# Patient Record
Sex: Female | Born: 1935 | Race: White | Hispanic: No
Health system: Southern US, Community
[De-identification: ages and names within clinical notes are randomized; demographics above are authoritative.]

---

## 2000-04-21 ENCOUNTER — Ambulatory Visit (HOSPITAL_COMMUNITY): Admission: RE | Admit: 2000-04-21 | Discharge: 2000-04-21 | Payer: Self-pay | Admitting: Internal Medicine

## 2000-11-22 ENCOUNTER — Ambulatory Visit (HOSPITAL_COMMUNITY): Admission: RE | Admit: 2000-11-22 | Discharge: 2000-11-22 | Payer: Self-pay | Admitting: Neurosurgery

## 2000-11-22 ENCOUNTER — Encounter: Payer: Self-pay | Admitting: Neurosurgery

## 2002-02-06 ENCOUNTER — Encounter: Payer: Self-pay | Admitting: Internal Medicine

## 2002-02-06 ENCOUNTER — Ambulatory Visit (HOSPITAL_COMMUNITY): Admission: RE | Admit: 2002-02-06 | Discharge: 2002-02-06 | Payer: Self-pay | Admitting: Internal Medicine

## 2002-03-06 ENCOUNTER — Ambulatory Visit (HOSPITAL_COMMUNITY): Admission: RE | Admit: 2002-03-06 | Discharge: 2002-03-06 | Payer: Self-pay | Admitting: Internal Medicine

## 2002-06-23 ENCOUNTER — Encounter (INDEPENDENT_AMBULATORY_CARE_PROVIDER_SITE_OTHER): Payer: Self-pay | Admitting: Internal Medicine

## 2002-06-23 ENCOUNTER — Ambulatory Visit (HOSPITAL_COMMUNITY): Admission: RE | Admit: 2002-06-23 | Discharge: 2002-06-23 | Payer: Self-pay | Admitting: Internal Medicine

## 2002-08-21 ENCOUNTER — Ambulatory Visit (HOSPITAL_COMMUNITY): Admission: RE | Admit: 2002-08-21 | Discharge: 2002-08-22 | Payer: Self-pay | Admitting: Ophthalmology

## 2002-08-21 ENCOUNTER — Encounter (INDEPENDENT_AMBULATORY_CARE_PROVIDER_SITE_OTHER): Payer: Self-pay | Admitting: *Deleted

## 2003-01-09 ENCOUNTER — Ambulatory Visit (HOSPITAL_COMMUNITY): Admission: RE | Admit: 2003-01-09 | Discharge: 2003-01-09 | Payer: Self-pay | Admitting: Neurology

## 2003-01-16 ENCOUNTER — Ambulatory Visit (HOSPITAL_COMMUNITY): Admission: RE | Admit: 2003-01-16 | Discharge: 2003-01-16 | Payer: Self-pay | Admitting: Neurology

## 2003-01-29 ENCOUNTER — Ambulatory Visit (HOSPITAL_BASED_OUTPATIENT_CLINIC_OR_DEPARTMENT_OTHER): Admission: RE | Admit: 2003-01-29 | Discharge: 2003-01-29 | Payer: Self-pay | Admitting: Neurology

## 2003-01-31 ENCOUNTER — Ambulatory Visit (HOSPITAL_COMMUNITY): Admission: RE | Admit: 2003-01-31 | Discharge: 2003-01-31 | Payer: Self-pay | Admitting: Neurology

## 2003-01-31 ENCOUNTER — Encounter (INDEPENDENT_AMBULATORY_CARE_PROVIDER_SITE_OTHER): Payer: Self-pay | Admitting: *Deleted

## 2003-02-09 ENCOUNTER — Ambulatory Visit: Admission: RE | Admit: 2003-02-09 | Discharge: 2003-02-09 | Payer: Self-pay | Admitting: Neurology

## 2003-03-01 ENCOUNTER — Ambulatory Visit (HOSPITAL_COMMUNITY): Admission: RE | Admit: 2003-03-01 | Discharge: 2003-03-01 | Payer: Self-pay | Admitting: Orthopedic Surgery

## 2003-03-01 ENCOUNTER — Ambulatory Visit (HOSPITAL_BASED_OUTPATIENT_CLINIC_OR_DEPARTMENT_OTHER): Admission: RE | Admit: 2003-03-01 | Discharge: 2003-03-01 | Payer: Self-pay | Admitting: Orthopedic Surgery

## 2003-03-01 ENCOUNTER — Encounter (INDEPENDENT_AMBULATORY_CARE_PROVIDER_SITE_OTHER): Payer: Self-pay | Admitting: Specialist

## 2003-03-01 ENCOUNTER — Encounter: Admission: RE | Admit: 2003-03-01 | Discharge: 2003-03-01 | Payer: Self-pay | Admitting: Orthopedic Surgery

## 2004-01-25 ENCOUNTER — Emergency Department (HOSPITAL_COMMUNITY): Admission: EM | Admit: 2004-01-25 | Discharge: 2004-01-25 | Payer: Self-pay | Admitting: Emergency Medicine

## 2004-01-30 ENCOUNTER — Observation Stay (HOSPITAL_COMMUNITY): Admission: RE | Admit: 2004-01-30 | Discharge: 2004-01-31 | Payer: Self-pay | Admitting: General Surgery

## 2004-01-30 ENCOUNTER — Ambulatory Visit: Payer: Self-pay | Admitting: *Deleted

## 2004-02-14 ENCOUNTER — Ambulatory Visit: Payer: Self-pay | Admitting: *Deleted

## 2004-09-22 ENCOUNTER — Ambulatory Visit (HOSPITAL_COMMUNITY): Admission: RE | Admit: 2004-09-22 | Discharge: 2004-09-22 | Payer: Self-pay | Admitting: General Surgery

## 2005-03-18 ENCOUNTER — Ambulatory Visit (HOSPITAL_COMMUNITY): Admission: RE | Admit: 2005-03-18 | Discharge: 2005-03-18 | Payer: Self-pay | Admitting: Internal Medicine

## 2005-03-18 ENCOUNTER — Ambulatory Visit: Payer: Self-pay | Admitting: Internal Medicine

## 2005-07-17 ENCOUNTER — Ambulatory Visit: Payer: Self-pay | Admitting: *Deleted

## 2005-07-20 ENCOUNTER — Ambulatory Visit (HOSPITAL_COMMUNITY): Admission: RE | Admit: 2005-07-20 | Discharge: 2005-07-20 | Payer: Self-pay | Admitting: *Deleted

## 2005-07-20 ENCOUNTER — Ambulatory Visit: Payer: Self-pay | Admitting: *Deleted

## 2005-07-21 ENCOUNTER — Ambulatory Visit (HOSPITAL_COMMUNITY): Admission: RE | Admit: 2005-07-21 | Discharge: 2005-07-21 | Payer: Self-pay | Admitting: *Deleted

## 2005-07-21 ENCOUNTER — Ambulatory Visit: Payer: Self-pay | Admitting: Cardiology

## 2005-07-28 ENCOUNTER — Ambulatory Visit: Payer: Self-pay | Admitting: *Deleted

## 2005-12-24 ENCOUNTER — Ambulatory Visit: Payer: Self-pay | Admitting: Cardiology

## 2006-01-27 ENCOUNTER — Ambulatory Visit: Payer: Self-pay | Admitting: Cardiology

## 2006-12-31 ENCOUNTER — Ambulatory Visit: Payer: Self-pay | Admitting: Internal Medicine

## 2007-01-31 DIAGNOSIS — G4730 Sleep apnea, unspecified: Secondary | ICD-10-CM | POA: Insufficient documentation

## 2007-01-31 DIAGNOSIS — I10 Essential (primary) hypertension: Secondary | ICD-10-CM | POA: Insufficient documentation

## 2007-02-01 ENCOUNTER — Ambulatory Visit: Payer: Self-pay | Admitting: Internal Medicine

## 2007-02-09 ENCOUNTER — Ambulatory Visit: Payer: Self-pay | Admitting: Pulmonary Disease

## 2007-02-09 DIAGNOSIS — R05 Cough: Secondary | ICD-10-CM

## 2007-02-09 DIAGNOSIS — R0602 Shortness of breath: Secondary | ICD-10-CM

## 2007-02-09 DIAGNOSIS — N19 Unspecified kidney failure: Secondary | ICD-10-CM | POA: Insufficient documentation

## 2007-02-15 ENCOUNTER — Encounter: Payer: Self-pay | Admitting: Internal Medicine

## 2007-03-18 ENCOUNTER — Ambulatory Visit: Payer: Self-pay | Admitting: Internal Medicine

## 2007-03-18 DIAGNOSIS — J9610 Chronic respiratory failure, unspecified whether with hypoxia or hypercapnia: Secondary | ICD-10-CM | POA: Insufficient documentation

## 2007-12-28 ENCOUNTER — Ambulatory Visit: Payer: Self-pay | Admitting: Cardiology

## 2008-01-23 ENCOUNTER — Telehealth (INDEPENDENT_AMBULATORY_CARE_PROVIDER_SITE_OTHER): Payer: Self-pay | Admitting: *Deleted

## 2008-02-15 ENCOUNTER — Ambulatory Visit: Payer: Self-pay | Admitting: Internal Medicine

## 2008-02-15 DIAGNOSIS — J449 Chronic obstructive pulmonary disease, unspecified: Secondary | ICD-10-CM | POA: Insufficient documentation

## 2008-02-15 DIAGNOSIS — J4489 Other specified chronic obstructive pulmonary disease: Secondary | ICD-10-CM | POA: Insufficient documentation

## 2008-03-28 ENCOUNTER — Ambulatory Visit: Payer: Self-pay | Admitting: Internal Medicine

## 2008-03-28 DIAGNOSIS — J82 Pulmonary eosinophilia, not elsewhere classified: Secondary | ICD-10-CM

## 2008-04-10 ENCOUNTER — Encounter: Payer: Self-pay | Admitting: Internal Medicine

## 2008-04-11 ENCOUNTER — Encounter: Payer: Self-pay | Admitting: Internal Medicine

## 2008-04-16 ENCOUNTER — Telehealth (INDEPENDENT_AMBULATORY_CARE_PROVIDER_SITE_OTHER): Payer: Self-pay | Admitting: *Deleted

## 2008-04-24 ENCOUNTER — Encounter: Payer: Self-pay | Admitting: Internal Medicine

## 2008-07-03 ENCOUNTER — Ambulatory Visit: Payer: Self-pay | Admitting: Internal Medicine

## 2008-10-11 ENCOUNTER — Ambulatory Visit: Payer: Self-pay | Admitting: Internal Medicine

## 2008-12-13 ENCOUNTER — Ambulatory Visit: Payer: Self-pay | Admitting: Internal Medicine

## 2008-12-14 ENCOUNTER — Telehealth (INDEPENDENT_AMBULATORY_CARE_PROVIDER_SITE_OTHER): Payer: Self-pay | Admitting: *Deleted

## 2008-12-14 LAB — CONVERTED CEMR LAB
BUN: 10 mg/dL (ref 6–23)
Basophils Absolute: 0 10*3/uL (ref 0.0–0.1)
Basophils Relative: 0.8 % (ref 0.0–3.0)
CO2: 32 meq/L (ref 19–32)
Calcium: 9.3 mg/dL (ref 8.4–10.5)
Chloride: 104 meq/L (ref 96–112)
Creatinine, Ser: 1.2 mg/dL (ref 0.4–1.2)
Eosinophils Absolute: 0.2 10*3/uL (ref 0.0–0.7)
Eosinophils Relative: 3.9 % (ref 0.0–5.0)
GFR calc non Af Amer: 45.13 mL/min (ref >60)
Glucose, Bld: 99 mg/dL (ref 70–99)
HCT: 37.2 % (ref 36.0–46.0)
Hemoglobin: 12.7 g/dL (ref 12.0–15.0)
Lymphocytes Relative: 34.8 % (ref 12.0–46.0)
Lymphs Abs: 1.6 10*3/uL (ref 0.7–4.0)
MCHC: 34.1 g/dL (ref 30.0–36.0)
MCV: 93.4 fL (ref 78.0–100.0)
Monocytes Absolute: 0.3 10*3/uL (ref 0.1–1.0)
Monocytes Relative: 6.7 % (ref 3.0–12.0)
Neutro Abs: 2.5 10*3/uL (ref 1.4–7.7)
Neutrophils Relative %: 53.8 % (ref 43.0–77.0)
Platelets: 166 10*3/uL (ref 150.0–400.0)
Potassium: 4.6 meq/L (ref 3.5–5.1)
Pro B Natriuretic peptide (BNP): 119 pg/mL — ABNORMAL HIGH (ref 0.0–100.0)
RBC: 3.98 M/uL (ref 3.87–5.11)
RDW: 12.2 % (ref 11.5–14.6)
Sed Rate: 27 mm/hr — ABNORMAL HIGH (ref 0–22)
Sodium: 144 meq/L (ref 135–145)
TSH: 2.48 microintl units/mL (ref 0.35–5.50)
WBC: 4.6 10*3/uL (ref 4.5–10.5)

## 2008-12-18 ENCOUNTER — Telehealth (INDEPENDENT_AMBULATORY_CARE_PROVIDER_SITE_OTHER): Payer: Self-pay | Admitting: *Deleted

## 2008-12-19 ENCOUNTER — Encounter: Payer: Self-pay | Admitting: Internal Medicine

## 2008-12-21 ENCOUNTER — Telehealth (INDEPENDENT_AMBULATORY_CARE_PROVIDER_SITE_OTHER): Payer: Self-pay | Admitting: *Deleted

## 2008-12-24 ENCOUNTER — Telehealth: Payer: Self-pay | Admitting: Internal Medicine

## 2009-01-15 ENCOUNTER — Encounter: Payer: Self-pay | Admitting: Internal Medicine

## 2010-02-25 NOTE — Letter (Signed)
Summary: CMN- Supplies / [facility]  CMN- Supplies / [facility]   Imported By: Lennie Odor 01/28/2009 15:31:31  _____________________________________________________________________  External Attachment:    Type:   Image     Comment:   External Document

## 2010-02-27 ENCOUNTER — Telehealth (INDEPENDENT_AMBULATORY_CARE_PROVIDER_SITE_OTHER): Payer: Self-pay | Admitting: *Deleted

## 2010-03-03 ENCOUNTER — Ambulatory Visit (INDEPENDENT_AMBULATORY_CARE_PROVIDER_SITE_OTHER): Payer: Medicare Other | Admitting: Adult Health

## 2010-03-03 ENCOUNTER — Encounter: Payer: Self-pay | Admitting: Adult Health

## 2010-03-03 DIAGNOSIS — J45909 Unspecified asthma, uncomplicated: Secondary | ICD-10-CM

## 2010-03-03 DIAGNOSIS — J9610 Chronic respiratory failure, unspecified whether with hypoxia or hypercapnia: Secondary | ICD-10-CM

## 2010-03-05 DIAGNOSIS — J45909 Unspecified asthma, uncomplicated: Secondary | ICD-10-CM | POA: Insufficient documentation

## 2010-03-05 NOTE — Progress Notes (Signed)
Summary: needs nebulizer meds<<<OV w/ TP  Phone Note Call from Patient   Caller: Daughter Melrose Nakayama Call For: wert Summary of Call: caller says [facility] is waiting on order for meds (for nebulizor). daughter nancy # [phone] Initial call taken by: Tivis Ringer, CNA,  February 27, 2010 3:01 PM  Follow-up for Phone Call        Pt needs OV for refills. She was last seen by [facility]  12/13/2008. Daughter aware but says pt only has enough medication for a few days.  Pt will see TP on Mon., 03/03/2010 to get refills then will need OV w/ MW. Follow-up by: Michel Bickers CMA,  February 27, 2010 4:56 PM

## 2010-03-07 ENCOUNTER — Encounter: Payer: Self-pay | Admitting: Internal Medicine

## 2010-03-12 ENCOUNTER — Encounter: Payer: Self-pay | Admitting: Adult Health

## 2010-03-13 NOTE — Assessment & Plan Note (Signed)
Summary: Acute NP office visit - COPD   Primary Provider/Referring Provider:  Sherril Croon   CC:  refill neb meds   Brovana and Pulmicort; gets from [facility] pt of Dr Sherene Sires.  History of Present Illness: 16  yowf never smoker with indolent onset chronic dry cough x 2007  better off bisphosponates and using brovana and pulmicort.  February 15, 2008 co doe x housework ok to mailbox but not up the street,  maybe would go shopping more if had better wind, using xopenex regularly, not as needed, care taker wants to know what to use next exac (happens twice a year on or off maint rx).  No sign cough. rec stop xopenex and consider wean down  Brovana and Budesonide, the equivalent of Symbicort, per neb. Not clear she did either.  March 28, 2008 ov no cough but still sob taking a shower but not sleeping on 02 .  Not clear how much she uses her nebs, caretaker confused re previous written instructions. no noct problems, overnight pulse ox ok despite desaturation with exertion so encouraged to use 02 as needed doe.  July 03, 2008 ov with increasing fatigue and doe x car > house. not following contingency instructions. no increase cough.  rec  resume twice daily nebulizer treatments with brovana with budesonide and supplement with xopenex up to every 4 hours if needed. if limited by short of breath with activity, wear 02 as needed  October 11, 2008 ov not limited by breathing able to do dollar store without 02. rec walk on 02 if need to walk > 50 ft.  December 13, 2008 cc gradually worse x last sev week sob and cough with cxr at [facility] "ok" no persistently purulent sputum, some better with nebs, not using 02 with activity as rec.   March 05, 2010--Returns for follow up and med refills. Seen >1 year ago has been doing okays since last vist. Needs refill on neb meds. She is tolerating her meds well. She is with her family member today. No flare of symptoms lately. no steroids use since last ov. Wears o2 as needed  with walking. Denies chest pain, dyspnea, orthopnea, hemoptysis, fever, n/v/d, edema, headache.     Medications Prior to Update: 1)  Pulmicort 0.5 Mg/71ml  Susp (Budesonide (Inhalation)) .... One  Twice Daily 2)  Brovana 15 Mcg/61ml  Nebu (Arformoterol Tartrate) .... One Twice Daily 3)  Restasis 0.05 %  Emul (Cyclosporine) .Marland Kitchen.. 1 Drop Each Eye Two Times A Day 4)  Lumigan 0.03 %  Soln (Bimatoprost) .Marland Kitchen.. 1 Drop Each Eye At Bedtime 5)  Namenda 5 Mg  Tabs (Memantine Hcl) .... Take 1 Tablet By Mouth Two Times A Day 6)  Paroxetine Hcl 20 Mg  Tabs (Paroxetine Hcl) .... Take 1 Tablet By Mouth Once A Day 7)  Alprazolam 0.5 Mg  Tabs (Alprazolam) .... Take 1 Tab By Mouth At Bedtime 8)  Furosemide 20 Mg  Tabs (Furosemide) .... Take 1 Tablet By Mouth Once A Day 9)  Namenda 10 Mg Tabs (Memantine Hcl) .Marland Kitchen.. 1 Two Times A Day 10)  Calcium 500/d 500-125 Mg-Unit  Tabs (Calcium Carbonate-Vitamin D) .... Take 1 Tablet By Mouth Two Times A Day 11)  Multivitamins   Tabs (Multiple Vitamin) .... Take 1 Tablet By Mouth Once A Day 12)  Metoprolol Succinate 50 Mg  Tb24 (Metoprolol Succinate) .... Take 1 Tablet By Mouth Once A Day 13)  Prilosec Otc 20 Mg  Tbec (Omeprazole Magnesium) .... Take One  30-60 Min Before First and Last Meals of The Day 14)  Ocuvite Preservision   Tabs (Multiple Vitamins-Minerals) .Marland Kitchen.. 1 Two Times A Day 15)  Tylenol Extra Strength 500 Mg  Tabs (Acetaminophen) .... 2 Every 4 Hours As Needed 16)  Delsym 30 Mg/56ml  Lqcr (Dextromethorphan Polistirex) .... 2 Tsp Every 12 Hours As Needed 17)  Xopenex 0.63 Mg/103ml Nebu (Levalbuterol Hcl) .... Neb Every 4 Hours Only If Needed 18)  Seroquel 25 Mg Tabs (Quetiapine Fumarate) .Marland Kitchen.. 1 Once Daily 19)  Aspirin Adult Low Strength 81 Mg Tbec (Aspirin) .Marland Kitchen.. 1 Once Daily 20)  Pepcid Ac Maximum Strength 20 Mg Tabs (Famotidine) .... One At Bedtime  Current Medications (verified): 1)  Pulmicort 0.25 Mg/70ml Susp (Budesonide) .... One Two Times A Day 2)  Brovana 15  Mcg/70ml  Nebu (Arformoterol Tartrate) .... One Twice Daily 3)  Restasis 0.05 %  Emul (Cyclosporine) .Marland Kitchen.. 1 Drop Each Eye Two Times A Day 4)  Lumigan 0.03 %  Soln (Bimatoprost) .Marland Kitchen.. 1 Drop Each Eye At Bedtime 5)  Namenda 5 Mg  Tabs (Memantine Hcl) .... Take 1 Tablet By Mouth Two Times A Day 6)  Paroxetine Hcl 20 Mg  Tabs (Paroxetine Hcl) .... Take 1 Tablet By Mouth Once A Day 7)  Alprazolam 0.5 Mg  Tabs (Alprazolam) .... Take 1 Tab By Mouth At Bedtime 8)  Furosemide 20 Mg  Tabs (Furosemide) .... Take 1 Tablet By Mouth Once A Day 9)  Namenda 10 Mg Tabs (Memantine Hcl) .Marland Kitchen.. 1 Two Times A Day 10)  Calcium 500/d 500-125 Mg-Unit  Tabs (Calcium Carbonate-Vitamin D) .... Take 1 Tablet By Mouth Two Times A Day 11)  Multivitamins   Tabs (Multiple Vitamin) .... Take 1 Tablet By Mouth Once A Day 12)  Metoprolol Succinate 50 Mg  Tb24 (Metoprolol Succinate) .... Take 1 Tablet By Mouth Once A Day 13)  Prilosec Otc 20 Mg  Tbec (Omeprazole Magnesium) .... Take One 30-60 Min Before First and Last Meals of The Day 14)  Ocuvite Preservision   Tabs (Multiple Vitamins-Minerals) .Marland Kitchen.. 1 Two Times A Day 15)  Tylenol Extra Strength 500 Mg  Tabs (Acetaminophen) .... 2 Every 4 Hours As Needed 16)  Delsym 30 Mg/20ml  Lqcr (Dextromethorphan Polistirex) .... 2 Tsp Every 12 Hours As Needed 17)  Xopenex 0.63 Mg/94ml Nebu (Levalbuterol Hcl) .... Neb Every 4 Hours Only If Needed 18)  Seroquel 25 Mg Tabs (Quetiapine Fumarate) .Marland Kitchen.. 1 Once Daily 19)  Aspirin Adult Low Strength 81 Mg Tbec (Aspirin) .Marland Kitchen.. 1 Once Daily 20)  Pepcid Ac Maximum Strength 20 Mg Tabs (Famotidine) .... One At Bedtime 21)  Azopt 1 % Susp (Brinzolamide) .Marland Kitchen.. 1 Drop Each Eye Two Times A Day  Allergies (verified): No Known Drug Allergies  Past History:  Past Medical History: Last updated: 12/13/2008 RENAL FAILURE (ICD-586) DYSPNEA (ICD-786.05)      - 02 dep with activity established  October 11, 2008  COUGH, CHRONIC (ICD-786.2) SLEEP APNEA  (ICD-780.57) HYPERTENSION (ICD-401.9)  Family History: Last updated: 03/18/2007 negative for respiratory  disease or allergies  Social History: Last updated: 03/18/2007 she has never smoked - dependent on her care takers for meds  Risk Factors: Smoking Status: never (02/09/2007)  Review of Systems      See HPI  Vital Signs:  Patient profile:   77 year old female Height:      66 inches Weight:      142 pounds BMI:     23.00 O2 Sat:  92 % on Room air Temp:     97.9 degrees F oral Pulse rate:   59 / minute BP sitting:   140 / 80  (left arm) Cuff size:   regular  Vitals Entered By: Kandice Hams CMA (March 03, 2010 2:29 PM)  O2 Flow:  Room air CC: refill neb meds   Brovana and Pulmicort; gets from [facility]. pt of Dr Sherene Sires Is Patient Diabetic? No Comments pharmacy [facility]; [facility]  Ambulatory Pulse Oximetry  Resting; HR__62___    02 Sat___94ra__  Lap1 (185 feet)   HR__75___   02 Sat__84ra___.  pt completed 3/4 lap.  returned pt to exam room, immediately placed on 2L o2.  recovery sat=94%. Lap2 (185 feet)   HR_____   02 Sat_____    Lap3 (185 feet)   HR_____   02 Sat_____  ___Test Completed without Difficulty _X__Test Stopped due to: decreased 02 sats.  Boone Master CNA/MA  March 03, 2010 3:58 PM    Physical Exam  Additional Exam:  in general very frail amb wf walking with rolling walker nad  wt  148 February 15, 2008 > 147 July 03, 2008  > 148 October 11, 2008 > 144 December 13, 2008 >>142 03/03/10 HEENT mild turbinate edema.  Oropharynx no thrush or excess pnd or cobblestoning.  No JVD or cervical adenopathy. Mild accessory muscle hypertrophy. Trachea midline, nl thryroid. Chest was hyperinflated by percussion with diminished breath sounds and moderate increased exp time without wheeze.  Regular rate and rhythm without murmur gallop or rub or increase P2.  no edema Abd: no hsm, nl excursion. Ext warm without cyanosis or  clubbing    Impression & Recommendations:  Problem # 1:  REACTIVE AIRWAY DISEASE (ICD-493.90) Compensated on brovana and budesonide neb  cont on current regimen  Medications Added to Medication List This Visit: 1)  Pulmicort 0.25 Mg/74ml Susp (Budesonide) .... One two times a day 2)  Azopt 1 % Susp (Brinzolamide) .Marland Kitchen.. 1 drop each eye two times a day  Other Orders: Est. Patient Level III ([address])  Patient Instructions: 1)  Wear Oxygen 2 l/m with actiivity  2)  Continue on same meds  3)  follow up Dr. Sherene Sires in 4months  4)  Please contact office for sooner follow up if symptoms do not improve or worsen  Prescriptions: PULMICORT 0.25 MG/2ML SUSP (BUDESONIDE) one two times a day  #60 x 11   Entered by:   Boone Master CNA/MA   Authorized by:   Rubye Oaks NP   Signed by:   Boone Master CNA/MA on 03/03/2010   Method used:   Printed then faxed to ...       [facility]* (retail)       [address]       [place], [place]  [address]       Ph: [phone]       Fax: [phone]   RxID:   [id number] BROVANA 15 MCG/2ML  NEBU (ARFORMOTEROL TARTRATE) one twice daily  #60 x 11   Entered and Authorized by:   Rubye Oaks NP   Signed by:   Tammy Parrett NP on 03/03/2010   Method used:   Print then Give to Patient   RxID:   [id number] PULMICORT 0.5 MG/2ML  SUSP (BUDESONIDE (INHALATION)) one  twice daily  #60 x 11   Entered and Authorized by:   Rubye Oaks NP  Signed by:   Tammy Parrett NP on 03/03/2010   Method used:   Print then Give to Patient   RxID:   [id number]

## 2010-03-19 NOTE — Letter (Signed)
Summary: CMN for Pulmicort & Brovana/[facility]  CMN for Pulmicort & Brovana/[facility]   Imported By: Sherian Rein 03/14/2010 15:13:32  _____________________________________________________________________  External Attachment:    Type:   Image     Comment:   External Document

## 2010-03-25 NOTE — Letter (Signed)
Summary: CMN for Nebulizer & Meds/[facility]  CMN for Nebulizer & Meds/[facility]   Imported By: Sherian Rein 03/17/2010 12:27:52  _____________________________________________________________________  External Attachment:    Type:   Image     Comment:   External Document

## 2010-06-10 NOTE — Assessment & Plan Note (Signed)
[facility]                             PULMONARY OFFICE NOTE   NAME:Lotter, LORIE CLECKLEY                    MRN:          [id number]  DATE:12/31/2006                            DOB:          1919-03-30    CHIEF COMPLAINT:  Cough and dyspnea.   HISTORY:  This is an 77 year old, white female, with chronic cough and  dyspnea dating back several years associated with swallowing difficulty.  Interestingly, she was felt to have possible aspiration a year ago and  underwent a barium swallow that was normal.  However, she tells me that  she just recently underwent another bedside evaluation during her  hospitalization two weeks ago and was told that she was indeed  aspirating by a speech therapist.  (This record is not available.)   The cough she has is more a daytime than nighttime and is actually  minimally productive.  She also has dyspnea with minimal activity to the  point where she cannot walk room to room without giving out.  She  denies any pleuritic pain ongoing, present difficulty swallowing,  fevers, chills, sweats, orthopnea, PND, or leg swelling but is feeling  some better after a recent round of prednisone.   PAST MEDICAL HISTORY:  1. Hypertension.  2. Kidney failure.  3. Sleep apnea.  4. Hysterectomy.  5. Appendectomy.  6. Remote sinus surgery.  7. Back surgery.   ALLERGIES:  None known.   MEDICATIONS:  1. Omeprazole dosed with meals.  2. Metoprolol at 50 mg per day.  3. Albuterol per nebulizer four times a day, but she does not think it      really helps.  For a full inventory with dosing, please see medication face sheet  accommodated December 31, 2006.   SOCIAL HISTORY:  She has never smoked.  She is retired from [employer]  where she was exposed to chemicals in the [employer] but had no  respiratory complaints then.   FAMILY HISTORY:  Negative for respiratory diseases or atypia.   REVIEW OF SYSTEMS:  Taken in detail on  the worksheet and negative except  as outlined above.   PHYSICAL EXAMINATION:  GENERAL:  This is a frail, elderly, white female,  who lets her daughter do all of her talking for her.  VITAL SIGNS:  She is afebrile with stable vital signs.  HEENT:  Unremarkable.  NECK:  Supple without cervical adenopathy or tenderness, trachea is  midline, no thyromegaly.  CHEST:  The lung fields were completely clear bilaterally to  auscultation and percussion.  HEART:  There is a regular rhythm without murmur, gallop, or rub.  ABDOMEN:  Soft, benign.  EXTREMITIES:  Without any calf tenderness, cyanosis, clubbing, or edema.   CT scan of the sinuses was performed on September 01, 2005, and reveals  changes consistent with chronic sinusitis.  CT scan of the chest was  performed on December 06, 2006, and shows scarring in the lung bases.   IMPRESSION:  This patient has a classic history that would suggest  intermittent aspiration and goes along with her general geriatric  failure to thrive.  We may not be able to do much more to help her, but  I thought it might be worth treating the airway component of the problem  more aggressively if she tolerates as follows:   Stop Ipratropium now because she has never smoked and, in general,  Atrovent is a drug for chronic obstructive pulmonary disease, which does  not apply here.  Try a Brovana and Budesonide in combination every 12  hours instead and continue to take Omeprazole, but dose it 36 minutes  before her first and last meal on an empiric basis for four weeks before  returning here.   In addition, because she may be getting spillover beta effect from  Metoprolol, I have recommended that she change the dose to one-half  b.i.d. to avoid any peaks that occur from the once daily dosing of 50.   Finally, I understand that she just recently started Actonel, but since  she has trouble swallowing I am going to ask her to stop it for now to  see to what extent  all of her problems improve, and then certainly she  could be rechallenged with the once monthly product or alternatively the  once yearly intravenous product that is now available through Part B of  Medicare.   I reviewed the general concept of protecting the lung with the patient  and her daughter, indicating that both be sure that she swallows, but  also anything in her stomach could end up in the lung and causing  further harm.  Therefore, I reviewed a very specific diet plan with her  to follow over the next four weeks on a trial basis only.     Charlaine Dalton. Sherene Sires, MD, [facility]  Electronically Signed    MBW/MedQ  DD: 12/31/2006  DT: 01/01/2007  Job #: [id number]   cc:   Lia Hopping

## 2010-06-13 NOTE — Procedures (Signed)
NAME:  Hannah Buckley, Hannah Buckley             ACCOUNT NO.:  [id number]   MEDICAL RECORD NO.:  [id number]          PATIENT TYPE:  OUT   LOCATION:  [facility]FT[facility]                          FACILITY:  [facility]   PHYSICIAN:  Vida Roller, M.D.   DATE OF BIRTH:  1919/12/14   DATE OF PROCEDURE:  DATE OF DISCHARGE:                                    STRESS TEST   HISTORY:  Hannah Buckley is an 77 year old female with no known coronary disease  who presents with complaints of dyspnea on exertion.  Her cardiac risk  factors include age and hypertension.   BASELINE DATA:  Electrocardiogram reveals sinus bradycardia at 57 beats per  minute with first-degree AV block and poor R-wave progression.  Her blood  pressure is 142/70.   36 mg of adenosine was infused over about 3 minutes and 40 seconds.  Myoview  was injected at 3 minutes.  The patient reported shortness of breath and a  funny feeling in her head that resolved in recovery.  EKG revealed transient  heart block with three consecutive blocked sinus beats and a baseline first-  degree AV block as above.   Final images and results are pending MD review.      Jae Dire, P.A. [facility]      Vida Roller, M.D.  Electronically Signed    AB/MEDQ  D:  07/20/2005  T:  07/20/2005  Job:  [id number]

## 2010-06-13 NOTE — Op Note (Signed)
NAME:  Hannah Buckley, Hannah Buckley             ACCOUNT NO.:  [id number]   MEDICAL RECORD NO.:  [id number]          PATIENT TYPE:  AMB   LOCATION:  DAY                           FACILITY:  [facility]   PHYSICIAN:  Barbaraann Barthel, M.D. DATE OF BIRTH:  11/27/1919   DATE OF PROCEDURE:  10/29/2004  DATE OF DISCHARGE:                                 OPERATIVE REPORT   SURGEON:  Barbaraann Barthel, M.D.   PREOPERATIVE DIAGNOSIS:  Incisional hernia which included an umbilical  hernia as well.   POSTOPERATIVE DIAGNOSIS:  Incisional hernia which included an umbilical  hernia as well.   PROCEDURE:  Incisional herniorrhaphy with repair of umbilical hernia as  well.   SPECIMENS:  None.   NOTE:  This is an 77 year old white female who had mid-abdominal discomfort,  complaining that this was increasing.  Clinically, she had a moderate-sized  hernia here; this was not incarcerated.  I suggested that because of her age  and her multiple medical problems, that we might want to treat this with a  binder of some sort and she was using a girdle and she was uncomfortable,  and opted for surgery.  We obtained cardiac clearance and medical clearance  preoperatively, and we admitted her via the outpatient department after  discussing the procedure in detail with her and her daughter.  We discussed  complications not limited to, but including bleeding, infection and  recurrence.  Informed consent was obtained.   GROSS OPERATIVE FINDINGS:  The patient had a moderate-sized hernia.  The  patient had very lax tissues and I decided not to use a mesh prosthesis.   TECHNIQUE:  The patient was placed in a supine position and after the  adequate administration of general anesthesia via endotracheal intubation,  her entire abdomen was prepped with Betadine solution and draped in the  usual manner.  Prior to this, a Foley catheter was aseptically inserted.  A  midline incision was carried out in the area of her previous  midline  incisions and the fascia was dissected freely around the defect.  She had a  kind of Swiss cheese defect along the previous midline closure.  These were  all connected and then I closed them with a figure-of-eight 0 Prolene  suture.  This was done without tension and a good closure was obtained.  I  used approximately 9 mL of 0.5% Sensorcaine to add to postoperative comfort,  irrigated the subcutaneous tissue and closed the wound with a stapling  device.  Prior to closure, all sponge, needle and instrument counts were  found to be correct.  Estimated blood loss was minimal.  The patient  received 600 mL of crystalloids intraoperatively.  There were no  complications.   We will have the cardiology department follow her perioperatively with [place].  She is admitted to observation in concentrated care and telemetry for  observation, and she will be discharged when medically deemed appropriate.     Will   WB/MEDQ  D:  01/30/2004  T:  01/30/2004  Job:  [id number]   cc:   Vida Roller, M.D.  Fax: [phone]  [facility]  [address]  [place]  [place] [address]  Fax: [phone]

## 2010-06-13 NOTE — Letter (Signed)
January 27, 2006    Lia Hopping, MD  [address]  [place], [place] [address]   RE:  JOZEE, HAMMER  MRN:  [id number]  /  DOB:  1920-01-09   Dear Dr. Olena Leatherwood:   Ms. Bawa returns to the office for continued assessment and treatment  of pedal edema.  Since starting furosemide, she has dramatically  improved.  She notes some mild edema, typically late in the evening,  that resolves by the morning.  Her testing was entirely negative.  Chemistry profile was normal, as was urinalysis.   I did receive records from [facility], which were reviewed.  These described her admission for pneumonia and subsequent x-rays and CT  scans.  There is no information regarding thyroid disease.   MEDICATIONS:  Unchanged from last visit except for the addition of  furosemide 20 mg daily.   EXAMINATION:  A pleasant, trim woman, in no acute distress.  The weight is 141, four pounds less than in November.  Blood pressure  120/70.  Heart rate is 68 and regular.  Respirations 18.  NECK:  No jugular venous distention; normal carotid upstrokes without  bruits.  Thyroid:  No mass palpable.  LUNGS:  Clear.  CARDIAC:  Normal 1st and 2nd heart sounds; 4th heart sound present.  ABDOMEN:  Soft and non-tender; no organomegaly.  EXTREMITIES:  Trace edema; distal pulses intact.   IMPRESSION:  Ms. Rabinovich is doing well with minimal diuretic therapy.  I  suggested that she continue this, at least until she has been euthyroid  for a period of time.  At that point, you may  wish to stop furosemide to determine if pedal edema will return or to  have her use it on a p.r.n. basis.  I will be happy to see this nice  woman again at any time you deem appropriate.  A followup chemistry  profile will be obtained in 6 weeks.  We will let you know if there are  any significant abnormalities.    Sincerely,      Gerrit Friends. Dietrich Pates, MD, [facility]  Electronically Signed    RMR/MedQ  DD: 01/27/2006  DT: 01/27/2006  Job  #: [id number]

## 2010-06-13 NOTE — Op Note (Signed)
NAME:  Hannah Buckley, Hannah Buckley                       ACCOUNT NO.:  [id number]   MEDICAL RECORD NO.:  [id number]                   PATIENT TYPE:  OIB   LOCATION:  2899                                 FACILITY:  [facility]   PHYSICIAN:  Lanna Poche, M.D.              DATE OF BIRTH:  11/07/1919   DATE OF PROCEDURE:  08/21/2002  DATE OF DISCHARGE:                                 OPERATIVE REPORT   PREOPERATIVE DIAGNOSIS:  Dislocated intraocular lens left eye.   POSTOPERATIVE DIAGNOSIS:  Dislocated intraocular lens left eye.   PROCEDURE:  Partial vitrectomy and removal of intraocular foreign body  (previous lens), and placement of secondary ILL in sulcus, left eye.   SURGEON:  Lanna Poche, M.D.   ASSISTANT:  Lorita Officer.   ANESTHESIA:  General endotracheal anesthesia.   ESTIMATED BLOOD LOSS:  Less than 1 mL.   COMPLICATIONS:  None.   PROCEDURE:  The patient was taken to the operating room and after general  endotracheal anesthesia the left eye was prepped and draped in the usual  sterile fashion.  A lid speculum was introduced and a conjunctival peritomy  was developed at the limbus from 9:00 through 5:00.  There were considerable  adhesions from the previous surgeries and this was sharply dissected with  the Vannas scissors.  Hemostasis was obtained with electrocautery.  Scars  were fashioned 3 mm posterior to the limbus at 10:00, 2:00 and 5:00.  The  superior sclerotomies were plugged and a 4 mm infusion cannula was secured  at the 5:00 sclerotomy with a temporary suture of 7-0 Vicryl.  The tip was  visually inspected and found to be in good position.  The superior sclera  was rotated into view with the 0.12 forceps.  There is an area of thinning  of the sclera where the previous incision having the choroid visible  underneath that.  A core length of 7 mm was marked 2 mm to the posterior  limbus and the crescentic blade used to make a partial thickness scleral  incision.  The  dissection was then continued anteriorly trying to avoid the  thin area superiorly.  Retracting the flap, it was seen that the incision at  one plane had taken a deeper pass and areas of prolapsing uviol tissue could  be seen.  A plane of more anterior dissection immediately underneath the  thinning superiorly was then proceeded with and then finally entering the  cornea for the cord length of 7 mm. The area of the deeper flap was then  sutured to the posterior lip of the scleral incision with a running suture  of 7-0 Vicryl.  A suture of 7-0 Vicryl was then placed over this incision  flap to secure it in place in two locations until the ILL was brought into  the anterior chamber.  A Landers ring was then secured to the globe with 7-0  Vicryl sutures at  3 and 9.  The plugs were removed and a flat lens was  applied to the surface of the eye. A small amount of vitreous haze was  washed out.  Inspection of the posterior poles revealed there to be a  flatplate like lens seen on the surface of the retina.  The Greishaber  vertical-acting scissors were then brought into view, inspected and found to  be in operating order. This was passed underneath the eye well and the edge  of the ILL grasped.  The ILL was brought to the pupillary plane.  The ILL  was then grasped with a Dork and grasping forceps to the superotemporal  sclerotomy.  The sutures holding the sclerotomy at the 12:00 position were  cut and the straighter forceps passed through this incision and down into  the plane of the ILL.  The ILL  was then grasped and pulled through the  pupillary aperture and out through the incision.  The iris was seen to be  free from entrapment.  The ILL was brought onto the table and inspected.  It  was a model CZ70BD, serial [id number], 20.0 diopters power.  This was  inspected and found to be free of defects.  It was grasped with the lens  inserter and the inferior haptic placed down over the top of the  iris.  The  Lester lens hook was then used to grasp the eyelid and place it in the plane  between the iris and the ring of anterior capsular fibrosis.  The lens was  then further manipulated passing it through the iris plane so that the  haptic was laying between the iris plane and the ring of encircling anterior  capsular fibrosis.  The remaining haptic was then grasped with the [facility]  lens hook and dialed underneath the iris in front of the area of capsular  fibrosis.  The lens was inspected and found to be in good position.  It was  tapped posterior until it was in the good position and was found to be  present between the plane of the anterior capsular fibrosis and the iris and  stable.  The superior sclerotomies were then closed with 7-0 Vicryl.  A  suture of 7-0 Vicryl was placed reapproximating the wound superiorly towards  the eyelid.  The wound was technically water tight and was closed with  additional sutures of 7-0 nylon.  The indirect ophthalmoscope was used to  inspect the scleral impression and peripheral retina.  No breaks or tears  were seen.  The infusion cannula was removed and the pledget sutures were  secured.  The conjunctiva was drawn up and reapproximated with interrupted  running sutures of 6-0 plain gut.  The pressure was checked with a Barraquer  tonometer and found to lay at approximately 21.  The subconjunctival space  was irrigated with 0.75% Marcaine followed by subconjunctival injection of  100 mg of ceftazidime and 25 mg of Decadron.  The lid speculum was then  removed and mixed antibiotic ointment applied to the surface of the eye.  An  eye patch and shield were then placed over the patient's left eye.  The  patient left the operating room in stable condition. Addendum:  A small  lateral peritomy was performed at the beginning of the procedure.  The  intraoperative field pressure was found to be insufficient for operation.  Lanna Poche, M.D.    JTH/MEDQ  D:  08/21/2002  T:  08/21/2002  Job:  [id number]

## 2010-06-13 NOTE — Cardiovascular Report (Signed)
[place]. [facility]  Patient:    Hannah Buckley, Hannah Buckley                    MRN: [id number] Proc. Date: 04/21/00 Adm. Date:  [id number] Attending:  Nathen May CC:         Dr. Olena Leatherwood, [facility] in [place]   Cardiac Catheterization  PREOPERATIVE DIAGNOSIS:  Recurrent syncope.  POSTOPERATIVE DIAGNOSIS:  Recurrent syncope.  OPERATION:  Tilt table testing.  SURGEON:  Nathen May, M.D., [facility]  The patient was submitted to a modification of the standard tilt table protocol.  The patient was tilted upright after equilibration in the supine position.  There was gradual fall in her systolic blood pressure from 130 to nadir of 102 but equilibrating at about 120.  Carotid sinus massage was obtained on the right side and the left side with no bradycardia.  IMPRESSION: 1. Mild orthostatic hypotension. 2. Negative carotid sinus massage.  RECOMMENDATIONS:  Implantation of a loop recorder. DD:  04/21/00 TD:  04/21/00 Job: [id number]

## 2010-06-13 NOTE — Op Note (Signed)
NAME:  Hannah Buckley, Hannah Buckley             ACCOUNT NO.:  [id number]   MEDICAL RECORD NO.:  [id number]          PATIENT TYPE:  AMB   LOCATION:  DAY                           FACILITY:  [facility]   PHYSICIAN:  R. Roetta Sessions, M.D. DATE OF BIRTH:  Jan 23, 1920   DATE OF PROCEDURE:  03/18/2005  DATE OF DISCHARGE:                                 OPERATIVE REPORT   PROCEDURE:  Colonoscopy with snare polypectomy.   INDICATIONS FOR PROCEDURE:  The patient is an 77 year old lady who had two  small adenomas removed from her cecum in 2004. She has done well. She is  devoid of any lower GI tract symptoms. Currently, she is here for  surveillance. This approach has been discussed with the patient at length.  Potential risks, benefits, and alternatives have been reviewed and questions  answered. She is agreeable. Please see documentation in the medical record.   PROCEDURE NOTE:  O2 saturation, blood pressure, pulse, and respirations were  monitored throughout the entire procedure. Conscious sedation with Versed 4  mg IV and fentanyl 75 mcg IV in divided doses. SB prophylaxis with  ampicillin 2 g IV, gentamicin 95 mg IV prior to the procedure.   INSTRUMENT:  Olympus video chip system, pediatric scope.   FINDINGS:  Digital rectal exam revealed no abnormalities.   ENDOSCOPIC FINDINGS:  Prep was good.   Rectum:  Examination of the rectal mucosa including retroflexed view of the  anal verge revealed a single anal papilla only.   Colon:  Colonic mucosa was surveyed from the rectosigmoid junction through  the left, transverse, and right colon to the area of the appendiceal  orifice, ileocecal valve, and cecum. These structures were well seen and  photographed for the record. From this level, the scope was slowly  withdrawn, and all previously mentioned mucosal surfaces were again seen.  The patient has the following abnormalities:  1.  Extensive left sided diverticula.  2.  There was a 4-mm polyp in the base  of the cecum which was cleanly      removed with a cold snare technique. At the time of this dictation,      recovery was still in process.   The patient tolerated the procedure well and was reactive to endoscopy.   IMPRESSION:  1.  Single anal papilla. Otherwise normal rectum.  2.  Extensive left sided diverticula. Small polyp at the base of the cecum      adjacent to the appendix, removed with cold snare technique as described      above.   RECOMMENDATIONS:  1.  Diverticulosis literature provided to Ms. Anise Salvo.  2.  Follow up on pathology when available.  3.  Further recommendations to follow.      Jonathon Bellows, M.D.  Electronically Signed     RMR/MEDQ  D:  03/18/2005  T:  03/19/2005  Job:  [id number]   cc:   Lia Hopping  Fax: [phone]

## 2010-06-13 NOTE — Op Note (Signed)
[place]. [facility]  Patient:    Hannah Buckley, Hannah Buckley Visit Number: [id number] MRN: [id number]          Service Type: DSU Location: [facility] 3172 02 Attending Physician:  Barton Fanny Dictated by:   Hewitt Shorts, M.D. Proc. Date: 11/22/00 Admit Date:  11/22/2000 Discharge Date: 11/22/2000                             Operative Report  PREOPERATIVE DIAGNOSIS:  Left carpal tunnel syndrome.  POSTOPERATIVE DIAGNOSIS:  Left carpal tunnel syndrome.  OPERATION PERFORMED:  Left carpal tunnel release.  SURGEON:  Hewitt Shorts, M.D.  ANESTHESIA:  Short arm Bier block and IV sedation.  INDICATIONS FOR PROCEDURE:  The patient is a 77 year old woman who presented with symptoms of left carpal tunnel syndrome which was confirmed electrodiagnostically. Decision was made to proceed with elective left carpal tunnel release.  DESCRIPTION OF PROCEDURE:  The patient was brought to the operating room.  A short arm Bier block was administered by the anesthesia service.  Prior to that the patient had been administered 1 gm of Ancef.  The patient was also given intravenous sedation by the anesthesia service.  The left hand and forearm were prepped with Betadine soap and solution and draped in a sterile fashion.  A good anesthesia was confirmed and then we proceeded with an incision adjacent to the thenar crease and distal to the distal carpal crease. The dissection was carried down to the subcutaneous tissue to the transverse carpal ligament which was opened from distal to proximal direction.  Care was taken to leave the underlying structures undisturbed.  The ligament itself was noted to be thickened and the median nerve appeared flattened as if it had been compressed.  The ligament was opened fully from distal to proximally and once that was done, hemostasis was checked for, established and confirmed and then we proceeded with closure.  The subcutaneous  tissues were approximated with interrupted 2-0 undyed Vicryl sutures.  The skin edges were approximated with interrupted horizontal mattress sutures placed with 3-0 nylon suture. The wound was dressed with Adaptic, fluff gauze sponges and wrapped with a Kling.  Once the Desma Paganini was applied, the tourniquet was released.  The patient tolerated the procedure well.  The estimated blood loss as nil.  Sponge and needle count were correct.  Following surgery the patient was transferred to the recovery room for further care. Dictated by:   Hewitt Shorts, M.D. Attending Physician:  Barton Fanny DD:  11/22/00 TD:  11/22/00 Job: [id number]

## 2010-06-13 NOTE — Op Note (Signed)
NAME:  Hannah Buckley, Hannah Buckley                         ACCOUNT NO.:  [id number]   MEDICAL RECORD NO.:  [id number]                   PATIENT TYPE:   LOCATION:                                       FACILITY:   PHYSICIAN:  R. Roetta Sessions, M.D.              DATE OF BIRTH:   DATE OF PROCEDURE:  03/06/2002  DATE OF DISCHARGE:                                 OPERATIVE REPORT   PROCEDURE PERFORMED:  Colonoscopy with biopsy.   INDICATIONS FOR PROCEDURE:  The patient is an 77 year old lady who comes for  screening colonoscopy.  She has never had her colon entirely imaged.  This  approach has been discussed with the patient.  The potential risks,  benefits, and alternatives have been reviewed and questions answered.  Please see my consultation note of 02/14/02 for more information.   MONITORING:  O2 saturation, blood pressure, and pulses were effectively  monitored through the entire procedure.   PREMEDICATION:  Conscious sedation and Versed 4 mg IV, fentanyl 75 mcg IV in  divided doses.   INSTRUMENT:  Olympus pediatric colonoscope.   FINDINGS:  Digital rectal exam revealed no abnormalities.   ENDOSCOPIC FINDINGS:  The prep was good.  The rectum: Examination of the  rectal mucosa including retroflexed view of the anal verge revealed only a  single anal papilla and internal hemorrhoids, otherwise the rectal mucosa  appeared normal.  Colon:  The colonic mucosa was surveyed from the  rectosigmoid junction through the left transverse and right colon to the  area of the appendiceal orifice, ileocecal valve and cecum.  These  structures were well seen for the record.  The patient was noted to have  numerous left-sided diverticula, particularly in the sigmoid colon.  In the  cecum at the appendix, there was a 3 mm polyp and a 5 mm sessile polyp  adjacent to one another.  Please see photos.  These were removed entirely  with cold biopsy forceps.  From this level, the scope was slowly withdrawn.  All  previously mentioned mucosal surfaces were again seen and no other  abnormalities were observed.  The patient tolerated the procedure well, was  reactive to endoscopy.   IMPRESSION:  1. Single anal papilla and internal hemorrhoids, otherwise normal rectum.  2. Sigmoid diverticulosis.  The remainder of the colonic mucosa normal     except for the two small polyps in the appendix which were removed as     described above.    RECOMMENDATIONS:  1. Diverticulosis literature was provided to the patient.  2. Follow up on pathology.  3. Further recommendations to follow.                                               Jonathon Bellows, M.D.    RMR/MEDQ  D:  03/06/2002  T:  03/06/2002  Job:  [id number]   cc:   Annette Stable Hasanaj  [address]  [place]  [place] [address]  Fax: [phone]

## 2010-06-13 NOTE — Letter (Signed)
December 24, 2005    Hannah Hopping, MD  [address]  [place], [place] [address]   RE:  Hannah Buckley, Hannah Buckley  MRN:  [id number]  /  DOB:  [birth year]   Dear Dr. Olena Buckley:   Hannah Buckley was seen in the office today at her request.  She recently  developed erythema and edema of the left leg, prompting a visit to one  of her physicians who diagnosed cellulitis and prescribed antibiotics.  The erythema and warmth have improved, but she continues to have  discomfort in the left leg and has noted some swelling in the right leg  as well.  She consulted a family member who is a Surveyor, mining  in [place].  The recommendation was for cardiology evaluation.   As you know, Hannah Buckley was formerly followed by Dr. Dorethea Clan, who  initially evaluated her for syncope.  More recently, she has had  dyspnea.  An echocardiogram performed in June was essentially normal, as  was a stress nuclear study.  She has had no chest discomfort.  She has  been seen by a pulmonologist, who performed a bronchoscopy.  It sounds  as if her diagnosis is probably chronic bronchitis in the absence of a  history of tobacco use.   There is no history of kidney disorders.  A chemistry profile performed  in April was normal.   She recently has been evaluated and treated for hyperthyroidism.  She is  taking methimazole 7.5 mg daily.  Her other medications include:  1. Aspirin 81 mg daily.  2. Brimonidine eye drops.  3. Latanoprost eye drops.  4. Glucosamine.  5. B12.  6. A multivitamin.  7. Toprol 50 mg daily.  8. Amoxicillin/clavulanate b.i.d.  9. Prevacid 30 mg daily.  10.Singulair 30 mg daily.  11.Xanax p.r.n.   PHYSICAL EXAMINATION:  Pleasant, well-appearing older woman in no acute  distress.  The weight is 145, 4 pounds more than in June of 2007, blood pressure  130/75, heart rate 60 and regular, respirations 16.  NECK:  No jugular venous distention; normal carotid upstrokes without  bruits.  HEENT:  No stare  nor other eye signs of hyperthyroidism.  LUNGS:  Clear.  CARDIAC:  Normal first and second heart sounds; fourth heart sound  present.  ABDOMEN:  Soft and nontender; no organomegaly; normal bowel sounds  without bruits.  EXTREMITIES:  1+ ankle and pretibial edema; slight scaling over the left  anterior lower leg; distal pulses intact.   EKG normal sinus rhythm; first degree AV block; markedly delayed R wave  progression - cannot exclude previous anteroseptal myocardial  infarction; left anterior fascicular block.  Voltage criteria for LVH.  Compared with her prior tracing October 04, 2003, the axis is more  shifted to the left.   IMPRESSION:  Hannah Buckley presents with pedal edema and no other evidence  for congestive heart failure.  Moreover, she has had a recent cardiac  workup including a stress test and echocardiogram.  In this setting,  edema is more likely related to venous disease, which is what you have  already told her.  This is all the more likely, since she previously  underwent sclerotherapy for varicose veins.  She is distressed by the  edema, and complains of symptoms including pain in her legs.  In this  setting, treatment with a diuretic is reasonable.  She will start  furosemide 20 mg daily.  We will monitor renal function and  electrolytes.  We will also assess  a urinalysis and chemistry profile to  exclude hepatic disease, renal disease, proteinuria or other possible  contributors to her edema.  She also has been told that hyperthyroidism  could contribute to this.  That certainly is possible, but she does not  appear clinically hyperthyroid.  I will seek recent medical records and  review them prior to her return visit in 1 month.    Sincerely,      Gerrit Friends. Dietrich Pates, MD, [facility]  Electronically Signed    RMR/MedQ  DD: 12/24/2005  DT: 12/25/2005  Job #: [id number]   CC:    Hildred Laser

## 2010-06-13 NOTE — Op Note (Signed)
   NAME:  Hannah Buckley, Hannah Buckley                       ACCOUNT NO.:  [id number]   MEDICAL RECORD NO.:  [id number]                   PATIENT TYPE:  OIB   LOCATION:  2891                                 FACILITY:  [facility]   PHYSICIAN:  Duke Salvia, M.D. [facility]           DATE OF BIRTH:  1919/02/01   DATE OF PROCEDURE:  DATE OF DISCHARGE:                                 OPERATIVE REPORT   PREOPERATIVE DIAGNOSIS:  Syncope.   POSTOPERATIVE DIAGNOSIS:  Syncope.   PROCEDURE:  Previously implanted loop, now at end of life, for explantation.   DESCRIPTION OF PROCEDURE:  After identification and informed consent was  obtained, the patient was brought to the electrophysiology laboratory and  placed on the fluoroscopic table in the supine position.  After routine prep  and drape, lidocaine was infiltrated along the line of the previous incision  and carried down to the layer of the loop low quarter pocket.  The pocket  was opened and the loop was explanted.  The two retaining sutures were  removed. The pocket was copiously irrigated with antibiotic containing  saline solution. In the anterior and posterior aspects of the pocket,  approximately one-third of the way down, were opposed with a 2-0 Vicryl  suture.  The wound was then closed in three layers in normal fashion.  The  wound was washed out and Benzoin and Steri-Strips dressing was applied.  Needle counts, sponge counts, and instrument counts were correct at the end  of the procedure according to the staff.  The patient tolerated the  procedure without apparent complications.                                               Duke Salvia, M.D. [facility]    SCK/MEDQ  D:  02/06/2002  T:  02/06/2002  Job:  [id number]   cc:   Dr. Vicie Mutters, [place]

## 2010-06-13 NOTE — Op Note (Signed)
[place]. [facility]  Patient:    Hannah Buckley, Hannah Buckley                    MRN: [id number] Proc. Date: 04/21/00 Adm. Date:  [id number] Attending:  Nathen May CC:         Dr. Olena Leatherwood, [facility] in [place]   Operative Report  PREOPERATIVE DIAGNOSIS:  Recurrent syncope.  POSTOPERATIVE DIAGNOSIS:  Recurrent syncope.  OPERATION:  Implantation of loop recorder.  SURGEON:  Nathen May, M.D., [facility]  DESCRIPTION OF PROCEDURE:  Following attainment of informed consent, the patient was then prepared for implantation of loop recorder with routine sterile prep and drape of the upper chest.  An incision was made 2 cm below and 1.5 cm lateral to the sternum and was carried down to the layer of the prepectoral fascia using electrocautery.  A pocket was formed using electrocautery.  Hemostasis was obtained.  Thereafter, two 2-0 silk sutures were placed at the cephalad portion of the pocket, and a Medtronic Reveal plus [id number] loop recorder was implanted, Serial Number [id number].  The recorder was secured to the prepectoral fascia. Hemostasis was again assured.  The wound was closed in three layers in the normal fashion.  The wound was washed, dried, and Benzoin and Steri-Strip dressing was then applied.  Needle counts, sponge counts, and instrument were correct at the end of the procedure according to the staff. DD:  04/21/00 TD:  04/21/00 Job: [id number]

## 2010-06-13 NOTE — H&P (Signed)
NAME:  Hannah Buckley, Hannah Buckley                         ACCOUNT NO.:  [id number]   MEDICAL RECORD NO.:  [id number]                  PATIENT TYPE:   LOCATION:                                       FACILITY:   PHYSICIAN:  R. Roetta Sessions, M.D.              DATE OF BIRTH:   DATE OF ADMISSION:  DATE OF DISCHARGE:                                HISTORY & PHYSICAL   CHIEF COMPLAINT:  Belching, need for colorectal cancer screening.   HISTORY OF PRESENT ILLNESS:  The patient is an 77 year old lady who was seen  by me previously for gastroesophageal reflux disease and rectal  incontinence.  The patient is much better in terms of her reflux with  Aciphex 20 mg orally daily and she came off this medication some time ago  and now has quite a bit of belching and occasional typical reflux symptoms.  No odynophagia.  No dysphagia.  No early satiety.  No nausea or vomiting.  No melena or rectal bleeding.  Her occasional rectal incontinence has gotten  better too.  She had a flexible sigmoidoscopy done back in 1999 and she was  found to have left-sided diverticula; exam was carried out to 55 cm.  She  has never had her entire lower GI tract evaluated.  She also has a slight  bulge intermittently coming out of her umbilicus which is easily pushed  in.  There is no family history of colorectal neoplasia.  Since Dr. Evlyn Clines  retired, she is now seeing Dr. Lia Hopping in [place].  I note that she has  lost from 191 to 167 over a period of the past three years; this has been  without much effort but really she is feeling good and doing well otherwise.  Prior EGD demonstrated some cork-screwing of the distal stomach and retained  food debris in the stomach back in 1995.  H. pylori serologies were  positive; she was treated with triple drug therapy back in 1996.  Gastric  emptying study revealed T-1/2  of 81 minutes.   PAST MEDICAL HISTORY:  Past medical history is significant for upper GI  bleed secondary to  peptic ulcer disease in the early 1980s, history of  hiatal hernia repair with an open laparotomy back in the late 1980s.  Multiple other surgeries include hysterectomy, right knee replacement, eye  surgery for cataracts and detached retina.  History of hypertension.   CURRENT MEDICATIONS:  1. Multivitamin daily.  2. Glucosamine.  3. ASA 81 mg daily.  4. Alprazolam 0.5 mg p.r.n.  5. DynaCirc 2.5 mg daily.  6. Vitamin E supplement.  7. Vitamin B6.  8. Vitamin B12.   ALLERGIES:  DEMEROL.   FAMILY HISTORY:  Daughter has a history of lupus.  Negative for chronic GI  or liver disease otherwise.   SOCIAL HISTORY:  She does not drink or use tobacco.   REVIEW OF SYSTEMS:  No chest pain.  No  dyspnea.  No fever or chills.   PHYSICAL EXAMINATION:  GENERAL:  Physical examination revealed a pleasant 34-  year-old lady resting comfortably.  Weight 167.  VITAL SIGNS:  Blood pressure 118/72, pulse of 68.  SKIN:  Skin warm and dry.  HEENT:  No scleral icterus.  Conjunctivae are pink.  Oral cavity:  No  lesions.  NECK:  JVD is not prominent.  CHEST:  Lungs are clear to auscultation.  CARDIAC:  Regular rate and rhythm without murmur, gallop or rub.  ABDOMEN:  She has well-healed epigastric and infraumbilical vertical scars.  She does have a golf-ball-size umbilical hernia which is easily reducible.  Abdomen is soft and nontender without appreciable mass or organomegaly  otherwise.  EXTREMITIES:  Extremities have no edema.  RECTAL:  Exam deferred until time of colonoscopy.   ADMITTING IMPRESSION:  The patient is a pleasant 77 year old lady with  intermittent belching and some typical reflux symptoms.  Along the way, she  has come off acid-suppressive therapy.  She did very well while on proton  pump inhibitor therapy previously.  She also has an easily reducible small  umbilical hernia.  She has never had her entire lower gastrointestinal tract  evaluated.   RECOMMENDATIONS:  1. We will  get her back on acid suppression in the way of Protonix 40 mg     orally daily and I have given a two-week supply of samples.  2. I have offered her a screening colonoscopy to evaluate her entire lower     GI tract for colorectal cancer screening purposes.  This approach has     been discussed and potential risks, benefits and alternatives have been     reviewed and questions answered; she is agreeable.  ASAP, we will plan to     perform colonoscopy in the near future at [facility]; further     recommendations to follow.                                               Jonathon Bellows, M.D.    RMR/MEDQ  D:  02/14/2002  T:  02/14/2002  Job:  [id number]   cc:   Aline Brochure.D.

## 2010-06-13 NOTE — Procedures (Signed)
NAME:  JONI, COLEGROVE             ACCOUNT NO.:  [id number]   MEDICAL RECORD NO.:  [id number]          PATIENT TYPE:  OUT   LOCATION:  RAD                           FACILITY:  [facility]   PHYSICIAN:  [place] Bing, M.D. [facility] OF BIRTH:  1919-12-07   DATE OF PROCEDURE:  07/21/2005  DATE OF DISCHARGE:                                  ECHOCARDIOGRAM   REFERRING:  Dr. Olena Leatherwood and Dr. Dorethea Clan   CLINICAL DATA:  An 77 year old woman with chest pain and hypertension.   M-MODE:  Aorta 3.1, left atrium 3.5, septum 1.3, posterior wall 1.1, LV  diastole 3.1, LV systole 2.2.   1.  Technically adequate echocardiographic study.  2.  Normal left atrium, right atrium, and right ventricle.  3.  Normal aortic root size with mild sclerosis of the valve and of the      proximal ascending aortic wall.  4.  Normal mitral valve; mild annular calcification.  5.  Normal tricuspid valve.  6.  Normal pulmonic valve and proximal pulmonary artery.  7.  Normal left ventricular size; borderline hypertrophy; normal regional      and global function.  8.  Physiologic pericardial effusion.      [place] Bing, M.D. [facility]  Electronically Signed     RR/MEDQ  D:  07/21/2005  T:  07/21/2005  Job:  [id number]

## 2010-06-13 NOTE — Op Note (Signed)
NAME:  Hannah Buckley, Hannah Buckley                       ACCOUNT NO.:  [id number]   MEDICAL RECORD NO.:  [id number]                   PATIENT TYPE:  AMB   LOCATION:  DSC                                  FACILITY:  [facility]   PHYSICIAN:  Cindee Salt, M.D.                    DATE OF BIRTH:  [birth year]   DATE OF PROCEDURE:  03/01/2003  DATE OF DISCHARGE:                                 OPERATIVE REPORT   PREOPERATIVE DIAGNOSIS:  Mucoid cyst, left thumb, degenerative arthritis  interphalangeal joint, mass left little finger.   POSTOPERATIVE DIAGNOSIS:  Mucoid cyst, left thumb, degenerative arthritis  interphalangeal joint, mass left little finger.   OPERATION:  Excision of mucoid cyst, debridement interphalangeal joint, left  thumb, tenosynovectomy flexors little finger left hand.   SURGEON:  Cindee Salt, M.D.   ASSISTANTCarolyne Fiscal   ANESTHESIA:  Forearm base, IV regional.   HISTORY:  The patient is an 77 year old female with a history of a mass on  her proximal phalanx left little finger.  This has not responded to  conservative treatment.  She also has a mass over the IP joint of her thumb,  degenerative arthritis on x-ray.   DESCRIPTION OF PROCEDURE:  The patient was brought to the operating room  where a forearm based IV regional anesthetic was carried out without  difficulty.  She was prepped using DuraPrep in a supine position, left arm  free.  The thumb was approached first.  A curvilinear incision was made over  the IP joint proximal phalanx, carried down through the subcutaneous tissue.  A large, multilobulated cyst was immediately encountered.  With blunt and  sharp dissection, this was dissected free and sent to pathology, protecting  the nail matrix distally.  The joint was then opened, the osteophytes were  removed with a rongeur both radial and ulnar aspect.  The joint showed no  further osteophyte impingement.  The wound was irrigated and the skin was  closed with interrupted 5-0  nylon sutures.   An oblique incision was made over the entire proximal phalanx of the little  finger and carried down through the subcutaneous tissue.  A large swelling  of the flexor sheath from A2 to A4 was encountered.  With sharp dissection,  protecting the radial and ulnar neurovascular bundles, synovectomy was  performed, taking care to maintain the A2 pulley and A3 pulley.  The wound  was irrigated. No further lesions were identified.  The specimen was sent to  pathology.  The skin was closed with interrupted 5-0 nylon sutures.  A  sterile compressive dressing and splint to the thumb was applied, a  compressive dressing to the little finger was applied.  The patient  tolerated the procedure well and was taken to the recovery room for  observation in satisfactory condition.  She is discharged home to return to  the [facility] in [place] in one week  on Vicodin and Keflex.                                              Cindee Salt, M.D.   Angelique Blonder  D:  03/01/2003  T:  03/01/2003  Job:  [id number]

## 2010-11-11 IMAGING — CR DG CHEST 2V
2 series · 2 of 2 positions shown · non-contrast
Comparison: CT chest 03/25/2004 and chest x-ray 03/01/2003

CLINICAL DATA: Shortness of breath and cough.  COPD.

CHEST - 2 VIEW

[view not recorded (1 of 2)]
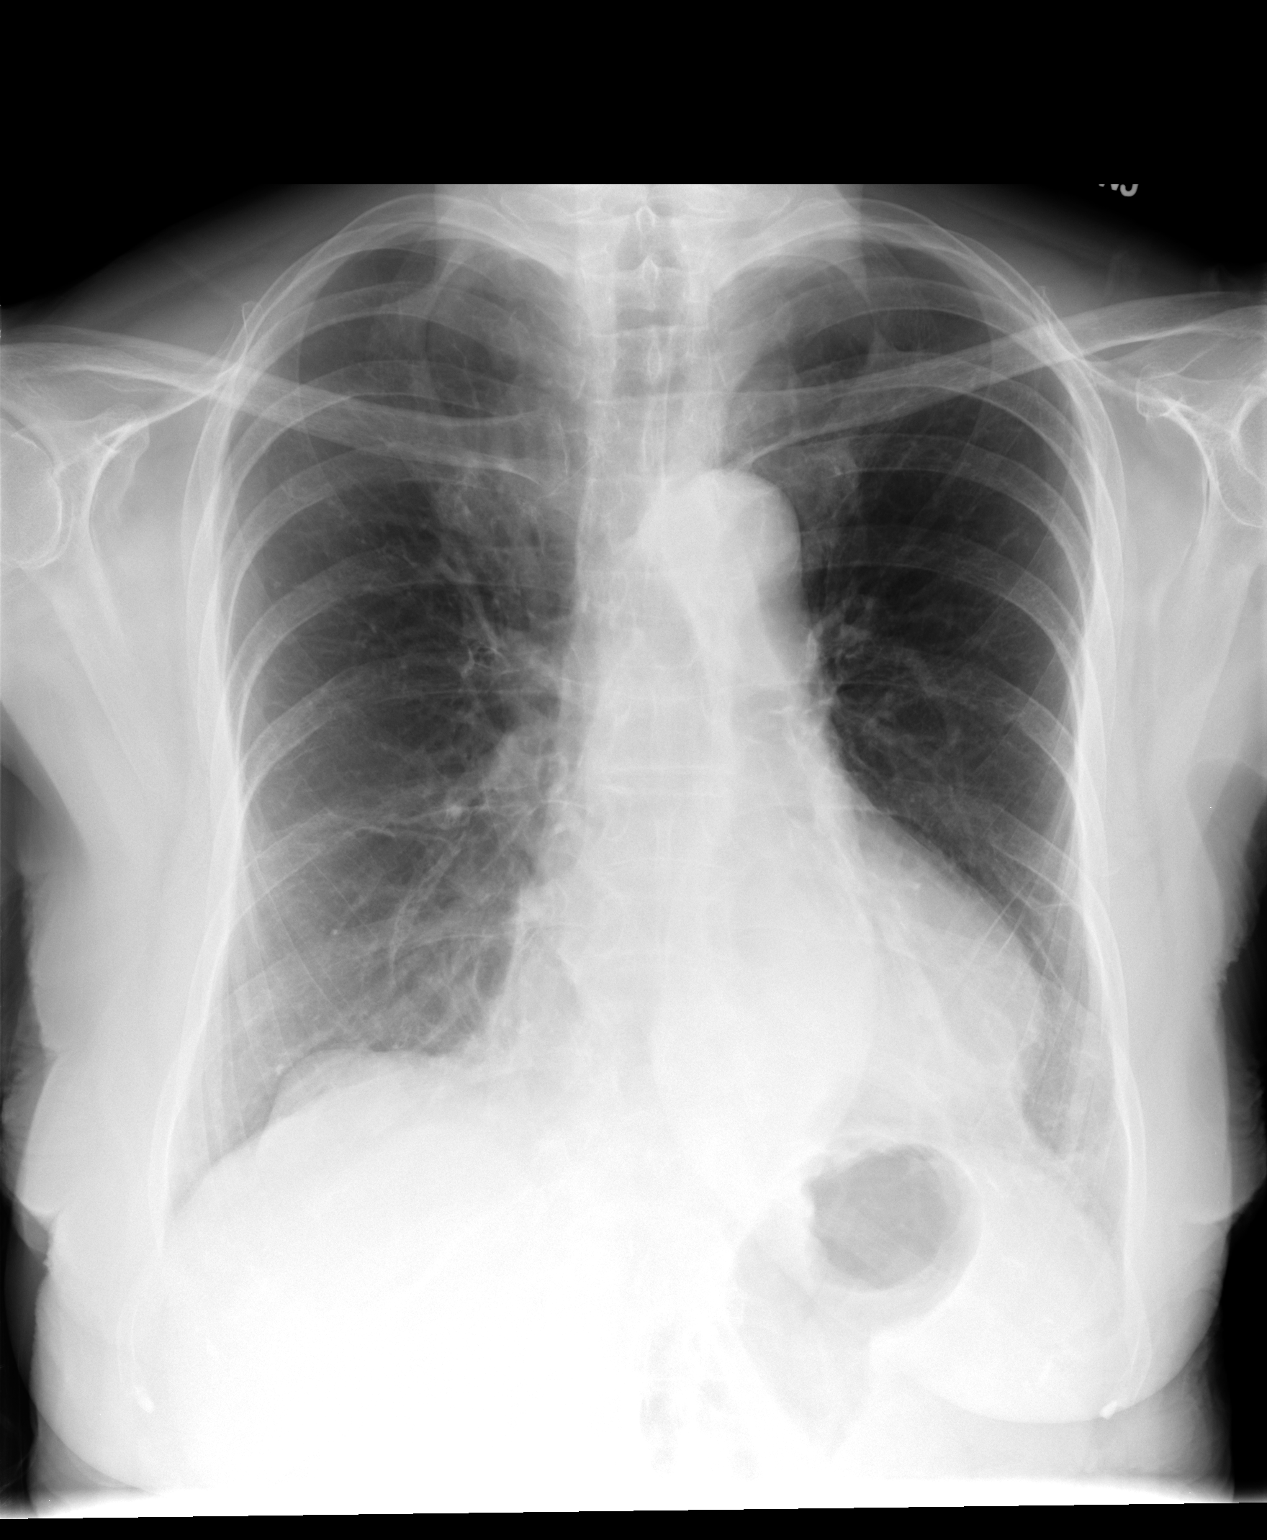

[view not recorded (2 of 2)]
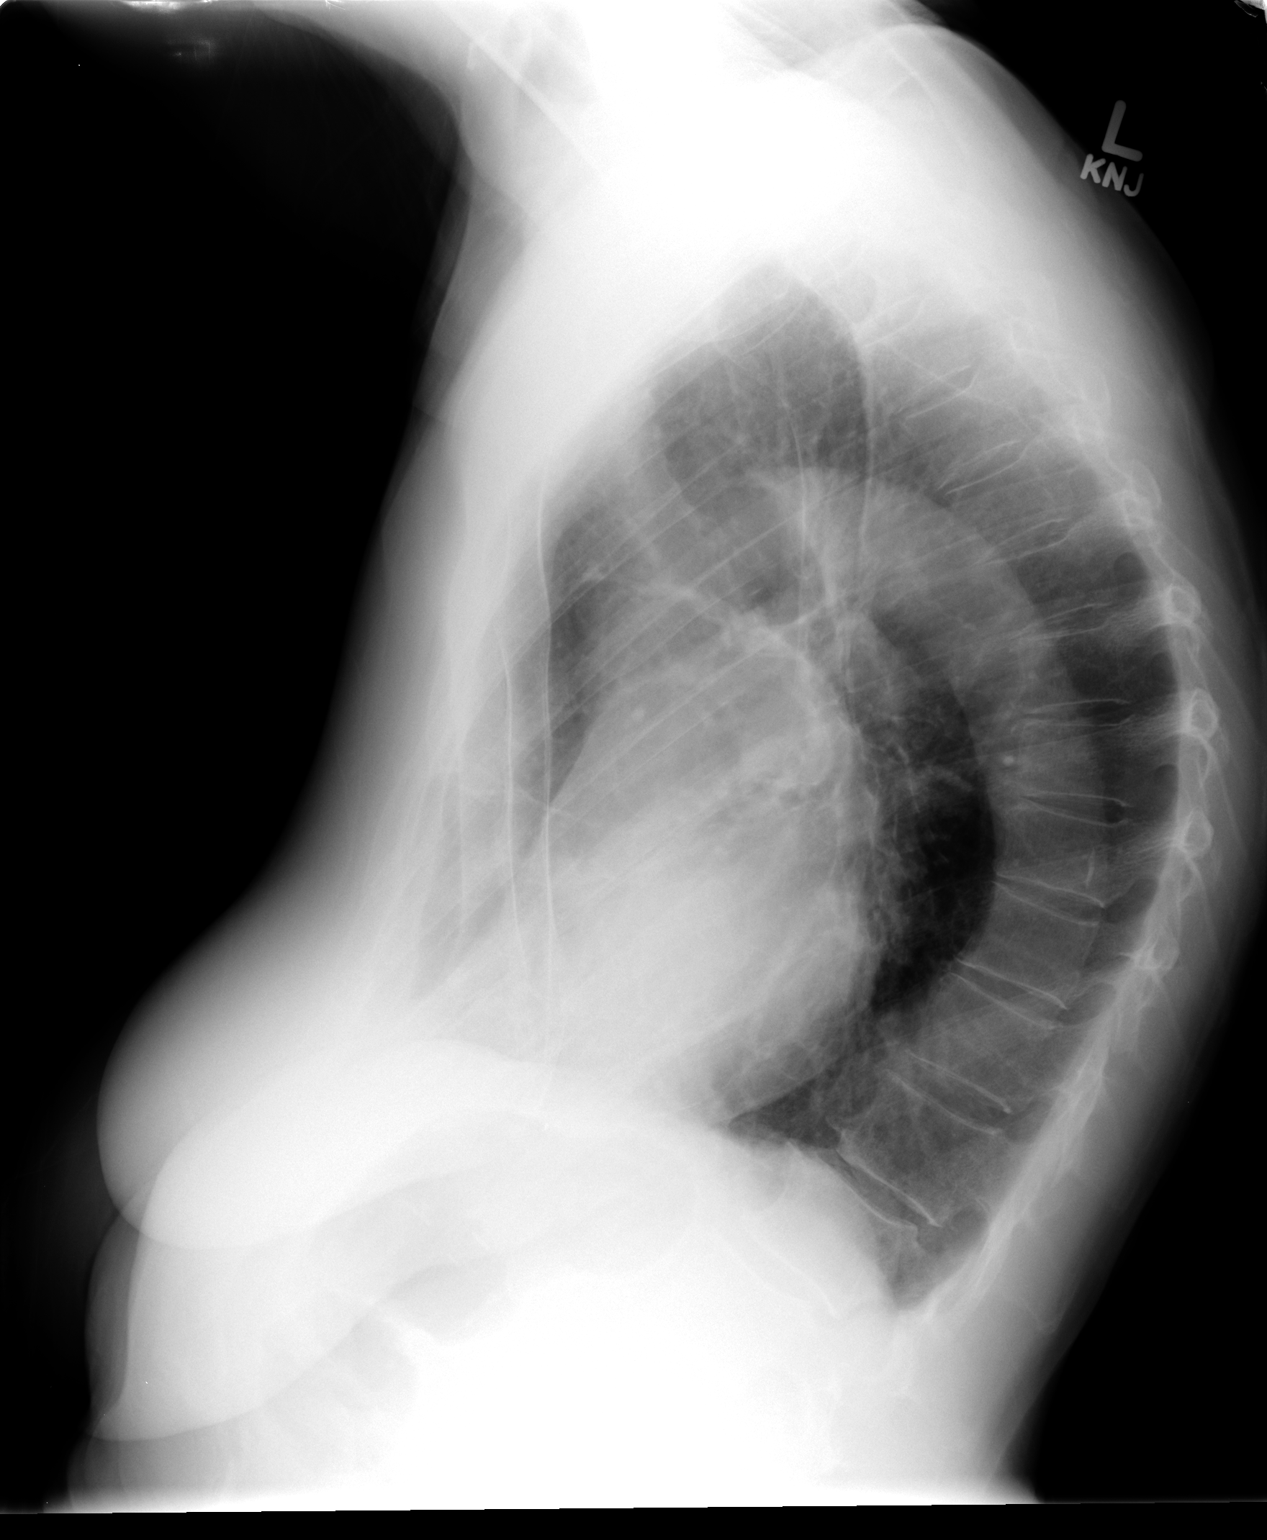

[2 of 2 positions shown; findings below may reference images not displayed]

FINDINGS: Trachea is midline.  Heart is enlarged, stable.  Thoracic
aorta is calcified and somewhat tortuous.  Increased opacity is
seen in the left lung base.  Lungs otherwise clear.  No pleural
fluid.    A prominent pectus deformity is noted.
IMPRESSION: Mildly increased opacity at the left lung base.  Acute air space
disease cannot be excluded.

## 2010-12-03 ENCOUNTER — Encounter (INDEPENDENT_AMBULATORY_CARE_PROVIDER_SITE_OTHER): Payer: Medicare Other | Admitting: Ophthalmology

## 2011-11-30 ENCOUNTER — Encounter (INDEPENDENT_AMBULATORY_CARE_PROVIDER_SITE_OTHER): Payer: Medicare Other | Admitting: Ophthalmology

## 2011-11-30 DIAGNOSIS — H43819 Vitreous degeneration, unspecified eye: Secondary | ICD-10-CM

## 2011-11-30 DIAGNOSIS — H3530 Unspecified macular degeneration: Secondary | ICD-10-CM

## 2012-05-30 ENCOUNTER — Ambulatory Visit (INDEPENDENT_AMBULATORY_CARE_PROVIDER_SITE_OTHER): Payer: Medicare Other | Admitting: Ophthalmology

## 2012-05-30 DIAGNOSIS — H35039 Hypertensive retinopathy, unspecified eye: Secondary | ICD-10-CM

## 2012-05-30 DIAGNOSIS — H3530 Unspecified macular degeneration: Secondary | ICD-10-CM

## 2012-05-30 DIAGNOSIS — H2700 Aphakia, unspecified eye: Secondary | ICD-10-CM

## 2012-05-30 DIAGNOSIS — I10 Essential (primary) hypertension: Secondary | ICD-10-CM

## 2013-06-21 ENCOUNTER — Ambulatory Visit (INDEPENDENT_AMBULATORY_CARE_PROVIDER_SITE_OTHER): Payer: Medicare Other | Admitting: Ophthalmology

## 2013-07-11 LAB — BASIC METABOLIC PANEL
BUN: 17 mg/dL (ref 4–21)
Creatinine: 0.8 mg/dL (ref 0.5–1.1)
Glucose: 135 mg/dL
Potassium: 4.5 mmol/L (ref 3.4–5.3)
SODIUM: 138 mmol/L (ref 137–147)

## 2013-07-11 LAB — HEPATIC FUNCTION PANEL
ALT: 8 U/L (ref 7–35)
AST: 8 U/L — AB (ref 13–35)
Alkaline Phosphatase: 68 U/L (ref 25–125)
Bilirubin, Total: 0.5 mg/dL

## 2013-07-11 LAB — CBC AND DIFFERENTIAL
HCT: 35 % — AB (ref 36–46)
Hemoglobin: 11.7 g/dL — AB (ref 12.0–16.0)
Platelets: 207 10*3/uL (ref 150–399)
WBC: 6.3 10^3/mL

## 2013-07-11 LAB — TSH: TSH: 0.6 u[IU]/mL (ref 0.41–5.90)

## 2013-07-12 ENCOUNTER — Other Ambulatory Visit: Payer: Self-pay | Admitting: Nurse Practitioner

## 2014-06-27 DEATH — deceased

## 2018-05-10 ENCOUNTER — Encounter: Payer: Self-pay | Admitting: Internal Medicine

## 2018-05-10 NOTE — Progress Notes (Signed)
A user error has taken place.

## 2019-12-05 ENCOUNTER — Encounter: Payer: Self-pay | Admitting: Internal Medicine

## 2019-12-05 NOTE — Progress Notes (Signed)
A user error has taken place.
